# Patient Record
Sex: Male | Born: 1960 | Race: White | Hispanic: No | Smoking: Never smoker
Health system: Southern US, Community
[De-identification: ages and names within clinical notes are randomized; demographics above are authoritative.]

## PROBLEM LIST (undated history)

## (undated) DIAGNOSIS — I447 Left bundle-branch block, unspecified: Secondary | ICD-10-CM

## (undated) HISTORY — DX: Left bundle-branch block, unspecified: I44.7

## (undated) HISTORY — PX: KNEE SURGERY: SHX244

## (undated) HISTORY — PX: APPENDECTOMY: SHX54

---

## 2000-02-22 ENCOUNTER — Encounter: Payer: Self-pay | Admitting: Specialist

## 2000-02-22 ENCOUNTER — Ambulatory Visit (HOSPITAL_COMMUNITY): Admission: RE | Admit: 2000-02-22 | Discharge: 2000-02-22 | Payer: Self-pay | Admitting: Specialist

## 2000-08-23 ENCOUNTER — Encounter: Admission: RE | Admit: 2000-08-23 | Discharge: 2000-09-07 | Payer: Self-pay | Admitting: Specialist

## 2001-07-17 ENCOUNTER — Encounter: Admission: RE | Admit: 2001-07-17 | Discharge: 2001-08-14 | Payer: Self-pay | Admitting: Specialist

## 2014-07-01 ENCOUNTER — Emergency Department (HOSPITAL_BASED_OUTPATIENT_CLINIC_OR_DEPARTMENT_OTHER)
Admission: EM | Admit: 2014-07-01 | Discharge: 2014-07-01 | Disposition: A | Discharge status: Home / Self Care | Payer: Self-pay | Attending: Emergency Medicine | Admitting: Emergency Medicine

## 2014-07-01 ENCOUNTER — Emergency Department (HOSPITAL_BASED_OUTPATIENT_CLINIC_OR_DEPARTMENT_OTHER): Payer: Self-pay

## 2014-07-01 ENCOUNTER — Encounter (HOSPITAL_BASED_OUTPATIENT_CLINIC_OR_DEPARTMENT_OTHER): Payer: Self-pay | Admitting: Emergency Medicine

## 2014-07-01 DIAGNOSIS — S4992XA Unspecified injury of left shoulder and upper arm, initial encounter: Secondary | ICD-10-CM | POA: Insufficient documentation

## 2014-07-01 DIAGNOSIS — Z7982 Long term (current) use of aspirin: Secondary | ICD-10-CM | POA: Insufficient documentation

## 2014-07-01 DIAGNOSIS — Y9389 Activity, other specified: Secondary | ICD-10-CM | POA: Insufficient documentation

## 2014-07-01 DIAGNOSIS — S0990XA Unspecified injury of head, initial encounter: Secondary | ICD-10-CM | POA: Insufficient documentation

## 2014-07-01 DIAGNOSIS — Y92410 Unspecified street and highway as the place of occurrence of the external cause: Secondary | ICD-10-CM | POA: Insufficient documentation

## 2014-07-01 DIAGNOSIS — S8001XA Contusion of right knee, initial encounter: Secondary | ICD-10-CM | POA: Insufficient documentation

## 2014-07-01 DIAGNOSIS — S161XXA Strain of muscle, fascia and tendon at neck level, initial encounter: Secondary | ICD-10-CM | POA: Insufficient documentation

## 2014-07-01 DIAGNOSIS — S8002XA Contusion of left knee, initial encounter: Secondary | ICD-10-CM | POA: Insufficient documentation

## 2014-07-01 DIAGNOSIS — S20219A Contusion of unspecified front wall of thorax, initial encounter: Secondary | ICD-10-CM | POA: Insufficient documentation

## 2014-07-01 DIAGNOSIS — Z9889 Other specified postprocedural states: Secondary | ICD-10-CM | POA: Insufficient documentation

## 2014-07-01 DIAGNOSIS — M25512 Pain in left shoulder: Secondary | ICD-10-CM

## 2014-07-01 DIAGNOSIS — S20212A Contusion of left front wall of thorax, initial encounter: Secondary | ICD-10-CM

## 2014-07-01 DIAGNOSIS — M25562 Pain in left knee: Secondary | ICD-10-CM

## 2014-07-01 MED ORDER — METHOCARBAMOL 500 MG PO TABS: 1000.0000 mg | ORAL_TABLET | Freq: Four times a day (QID) | ORAL | Status: AC

## 2014-07-01 MED ORDER — NAPROXEN 500 MG PO TABS: 500.0000 mg | ORAL_TABLET | Freq: Two times a day (BID) | ORAL | Status: AC

## 2014-07-01 MED ORDER — OXYCODONE-ACETAMINOPHEN 5-325 MG PO TABS: 1.0000 | ORAL_TABLET | Freq: Four times a day (QID) | ORAL | Status: AC | PRN

## 2014-07-01 MED ORDER — OXYCODONE-ACETAMINOPHEN 5-325 MG PO TABS
2.0000 | ORAL_TABLET | Freq: Once | ORAL | Status: AC
  Administered 2014-07-01: 2 via ORAL
  Filled 2014-07-01: qty 2
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
## 2014-07-01 NOTE — ED Notes (Signed)
Patient transported to X-ray 
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
## 2014-07-01 NOTE — ED Notes (Signed)
PA at bedside.

## 2014-07-02 NOTE — ED Provider Notes (Signed)
Medical screening examination/treatment/procedure(s) were performed by non-physician practitioner and as supervising physician I was immediately available for consultation/collaboration.   EKG Interpretation None       Jorene Kaylor, MD 07/02/14 0117 

## 2014-07-03 ENCOUNTER — Emergency Department (HOSPITAL_BASED_OUTPATIENT_CLINIC_OR_DEPARTMENT_OTHER): Payer: No Typology Code available for payment source

## 2014-07-03 ENCOUNTER — Encounter (HOSPITAL_BASED_OUTPATIENT_CLINIC_OR_DEPARTMENT_OTHER): Payer: Self-pay | Admitting: Emergency Medicine

## 2014-07-03 ENCOUNTER — Emergency Department (HOSPITAL_BASED_OUTPATIENT_CLINIC_OR_DEPARTMENT_OTHER)
Admission: EM | Admit: 2014-07-03 | Discharge: 2014-07-03 | Disposition: A | Discharge status: Home / Self Care | Payer: No Typology Code available for payment source | Attending: Emergency Medicine | Admitting: Emergency Medicine

## 2014-07-03 DIAGNOSIS — Z791 Long term (current) use of non-steroidal anti-inflammatories (NSAID): Secondary | ICD-10-CM | POA: Insufficient documentation

## 2014-07-03 DIAGNOSIS — S8011XD Contusion of right lower leg, subsequent encounter: Secondary | ICD-10-CM | POA: Insufficient documentation

## 2014-07-03 DIAGNOSIS — Z7982 Long term (current) use of aspirin: Secondary | ICD-10-CM | POA: Insufficient documentation

## 2014-07-03 DIAGNOSIS — S2232XD Fracture of one rib, left side, subsequent encounter for fracture with routine healing: Secondary | ICD-10-CM | POA: Insufficient documentation

## 2014-07-03 DIAGNOSIS — S0001XD Abrasion of scalp, subsequent encounter: Secondary | ICD-10-CM | POA: Insufficient documentation

## 2014-07-03 LAB — COMPREHENSIVE METABOLIC PANEL
ALK PHOS: 75 U/L (ref 39–117)
ALT: 18 U/L (ref 0–53)
ANION GAP: 13 (ref 5–15)
AST: 24 U/L (ref 0–37)
Albumin: 3.8 g/dL (ref 3.5–5.2)
BUN: 17 mg/dL (ref 6–23)
CO2: 26 mEq/L (ref 19–32)
CREATININE: 1 mg/dL (ref 0.50–1.35)
Calcium: 9.3 mg/dL (ref 8.4–10.5)
Chloride: 102 mEq/L (ref 96–112)
GFR calc Af Amer: >90 mL/min (ref >90)
GFR calc non Af Amer: 84 mL/min — ABNORMAL LOW (ref >90)
GLUCOSE: 91 mg/dL (ref 70–99)
POTASSIUM: 4 meq/L (ref 3.7–5.3)
Sodium: 141 mEq/L (ref 137–147)
Total Bilirubin: 0.4 mg/dL (ref 0.3–1.2)
Total Protein: 7.6 g/dL (ref 6.0–8.3)

## 2014-07-03 LAB — CBC WITH DIFFERENTIAL/PLATELET
BASOS PCT: 1 % (ref 0–1)
Basophils Absolute: 0.1 10*3/uL (ref 0.0–0.1)
EOS ABS: 1 10*3/uL — AB (ref 0.0–0.7)
Eosinophils Relative: 13 % — ABNORMAL HIGH (ref 0–5)
HEMATOCRIT: 41.4 % (ref 39.0–52.0)
HEMOGLOBIN: 14.1 g/dL (ref 13.0–17.0)
LYMPHS ABS: 1.4 10*3/uL (ref 0.7–4.0)
Lymphocytes Relative: 17 % (ref 12–46)
MCH: 32.9 pg (ref 26.0–34.0)
MCHC: 34.1 g/dL (ref 30.0–36.0)
MCV: 96.7 fL (ref 78.0–100.0)
MONO ABS: 1 10*3/uL (ref 0.1–1.0)
MONOS PCT: 12 % (ref 3–12)
NEUTROS PCT: 57 % (ref 43–77)
Neutro Abs: 4.7 10*3/uL (ref 1.7–7.7)
Platelets: 243 10*3/uL (ref 150–400)
RBC: 4.28 MIL/uL (ref 4.22–5.81)
RDW: 13 % (ref 11.5–15.5)
WBC: 8.2 10*3/uL (ref 4.0–10.5)

## 2014-07-03 MED ORDER — SODIUM CHLORIDE 0.9 % IV SOLN
INTRAVENOUS | Status: DC
  Administered 2014-07-03: 12:00:00 via INTRAVENOUS

## 2014-07-03 MED ORDER — IOHEXOL 300 MG/ML  SOLN
100.0000 mL | Freq: Once | INTRAMUSCULAR | Status: AC | PRN
  Administered 2014-07-03: 100 mL via INTRAVENOUS

## 2014-07-03 MED ORDER — HYDROMORPHONE HCL 1 MG/ML IJ SOLN
1.0000 mg | Freq: Once | INTRAMUSCULAR | Status: AC
  Administered 2014-07-03: 1 mg via INTRAVENOUS
  Filled 2014-07-03: qty 1

## 2014-07-03 MED ORDER — OXYCODONE-ACETAMINOPHEN 5-325 MG PO TABS: 1.0000 | ORAL_TABLET | ORAL | Status: AC | PRN
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
## 2014-07-03 NOTE — Discharge Instructions (Signed)
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
## 2017-12-09 ENCOUNTER — Encounter: Payer: Self-pay | Admitting: Cardiology

## 2017-12-16 DIAGNOSIS — R079 Chest pain, unspecified: Secondary | ICD-10-CM | POA: Diagnosis not present

## 2017-12-19 ENCOUNTER — Encounter: Payer: Self-pay | Admitting: *Deleted

## 2017-12-19 DIAGNOSIS — I447 Left bundle-branch block, unspecified: Secondary | ICD-10-CM | POA: Insufficient documentation

## 2017-12-19 DIAGNOSIS — R9431 Abnormal electrocardiogram [ECG] [EKG]: Secondary | ICD-10-CM | POA: Insufficient documentation

## 2017-12-19 HISTORY — DX: Left bundle-branch block, unspecified: I44.7

## 2017-12-19 DEATH — deceased

## 2017-12-21 ENCOUNTER — Ambulatory Visit: Payer: BLUE CROSS/BLUE SHIELD | Admitting: Cardiology
